# Patient Record
Sex: Male | Born: 1960
Health system: Southern US, Community
[De-identification: ages and names within clinical notes are randomized; demographics above are authoritative.]

## PROBLEM LIST (undated history)

## (undated) ENCOUNTER — Emergency Department (HOSPITAL_COMMUNITY): Disposition: A | Discharge status: Other Acute Inpatient Hospital | Payer: Self-pay

## (undated) DIAGNOSIS — N200 Calculus of kidney: Secondary | ICD-10-CM

## (undated) DIAGNOSIS — T7840XA Allergy, unspecified, initial encounter: Secondary | ICD-10-CM

## (undated) DIAGNOSIS — F99 Mental disorder, not otherwise specified: Secondary | ICD-10-CM

## (undated) DIAGNOSIS — G4730 Sleep apnea, unspecified: Secondary | ICD-10-CM

## (undated) DIAGNOSIS — I10 Essential (primary) hypertension: Secondary | ICD-10-CM

## (undated) DIAGNOSIS — K219 Gastro-esophageal reflux disease without esophagitis: Secondary | ICD-10-CM

## (undated) HISTORY — PX: LITHOTRIPSY: SUR834

## (undated) HISTORY — DX: Sleep apnea, unspecified: G47.30

## (undated) HISTORY — PX: APPENDECTOMY: SHX54

## (undated) HISTORY — DX: Allergy, unspecified, initial encounter: T78.40XA

---

## 1998-05-17 ENCOUNTER — Encounter: Admission: RE | Admit: 1998-05-17 | Discharge: 1998-08-15 | Payer: Self-pay | Admitting: Family Medicine

## 2011-10-09 ENCOUNTER — Other Ambulatory Visit: Payer: Self-pay | Admitting: Urology

## 2011-10-09 ENCOUNTER — Encounter (HOSPITAL_COMMUNITY): Payer: Self-pay | Admitting: Pharmacy Technician

## 2011-10-11 ENCOUNTER — Encounter (HOSPITAL_COMMUNITY): Payer: Self-pay | Admitting: *Deleted

## 2011-10-13 NOTE — H&P (Signed)
Urology Admission H&P  Chief Complaint: Right renal calculus for ESWL  History of Present Illness:    Tim Jones presents today as a more urgent walk-in. He has had recurrent nephrolithiasis and was last seen here about 2 years ago. At that time he had passed a small stone. He has been known in the past to have approximately a 9 mm stone in his right renal unit. We had talked in the past about elective lithotripsy. He has done fairly well clinically but has had gross hematuria over the last 2 days. He has had some increase obstructive voiding symptoms. He has had some mild right-sided flank discomfort.    Past Medical History  Diagnosis Date  . Hypertension   . GERD (gastroesophageal reflux disease)   . Mental disorder     OCD   Past Surgical History  Procedure Date  . Appendectomy     age 51    Home Medications:  No prescriptions prior to admission   Allergies:  Allergies  Allergen Reactions  . Codeine Nausea And Vomiting    History reviewed. No pertinent family history. Social History:  reports that he has been smoking Pipe and Cigars.  He does not have any smokeless tobacco history on file. He reports that he drinks alcohol. He reports that he does not use illicit drugs.  Review of Systems  Genitourinary: Positive for hematuria.  All other systems reviewed and are negative.    Physical Exam:  Vital signs in last 24 hours:   Physical Exam  Constitutional: He is oriented to person, place, and time. He appears well-developed and well-nourished.  HENT:  Head: Normocephalic and atraumatic.  Cardiovascular: Normal rate and regular rhythm.   Respiratory: Effort normal.  GI: Soft. He exhibits no distension and no mass. There is no tenderness.  Neurological: He is alert and oriented to person, place, and time.  Skin: Skin is warm and dry.    Laboratory Data:  No results found for this or any previous visit (from the past 24 hour(s)). No results found for this or any  previous visit (from the past 240 hour(s)). Creatinine: No results found for this basename: CREATININE:7 in the last 168 hours Baseline Creatinine:   Impression/Assessment:  Right renal calculus with gross hematuria.  Plan:  Tim Jones underwent a routine stone protocol CT. The official report remains pending but on my review the stone that was previously noted now appears to be in his right renal pelvis. There does not appear to be a significant degree of obstruction. This stone measures approximately 9 mm. At this point I do think treatment is indicated. He is interested in trying to have something done definitively. Given the current size and location of the stone, the treatment of choice would be lithotripsy. He understands that fragmentation at times can be less than ideal which can result in need for secondary procedure such as ureteroscopy, stent placement, etc. Again, however, I think lithotripsy would be the ideal treatment for this stone. We will attempt to get him on the schedule for sometime in our next opening. We will be sure that he does have some pain medication on hand in case this does become more uncomfortable.  Tim Jones S 10/13/2011, 5:33 PM

## 2011-10-14 ENCOUNTER — Ambulatory Visit (HOSPITAL_COMMUNITY): Payer: 59

## 2011-10-14 ENCOUNTER — Other Ambulatory Visit: Payer: Self-pay | Admitting: Urology

## 2011-10-14 ENCOUNTER — Ambulatory Visit (HOSPITAL_COMMUNITY)
Admission: RE | Admit: 2011-10-14 | Discharge: 2011-10-14 | Disposition: A | Discharge status: Home / Self Care | Payer: 59 | Source: Ambulatory Visit | Attending: Urology | Admitting: Urology

## 2011-10-14 ENCOUNTER — Emergency Department (HOSPITAL_COMMUNITY)
Admission: EM | Admit: 2011-10-14 | Discharge: 2011-10-15 | Disposition: A | Discharge status: Home / Self Care | Payer: 59 | Attending: Emergency Medicine | Admitting: Emergency Medicine

## 2011-10-14 ENCOUNTER — Encounter (HOSPITAL_COMMUNITY): Payer: Self-pay | Admitting: Emergency Medicine

## 2011-10-14 ENCOUNTER — Encounter (HOSPITAL_COMMUNITY)
Admission: RE | Disposition: A | Discharge status: Home / Self Care | Payer: Self-pay | Source: Ambulatory Visit | Attending: Urology

## 2011-10-14 DIAGNOSIS — F17200 Nicotine dependence, unspecified, uncomplicated: Secondary | ICD-10-CM | POA: Insufficient documentation

## 2011-10-14 DIAGNOSIS — R339 Retention of urine, unspecified: Secondary | ICD-10-CM

## 2011-10-14 DIAGNOSIS — I10 Essential (primary) hypertension: Secondary | ICD-10-CM | POA: Insufficient documentation

## 2011-10-14 DIAGNOSIS — K219 Gastro-esophageal reflux disease without esophagitis: Secondary | ICD-10-CM | POA: Insufficient documentation

## 2011-10-14 DIAGNOSIS — N200 Calculus of kidney: Secondary | ICD-10-CM | POA: Diagnosis present

## 2011-10-14 DIAGNOSIS — Z79899 Other long term (current) drug therapy: Secondary | ICD-10-CM | POA: Insufficient documentation

## 2011-10-14 HISTORY — DX: Calculus of kidney: N20.0

## 2011-10-14 HISTORY — DX: Mental disorder, not otherwise specified: F99

## 2011-10-14 HISTORY — DX: Gastro-esophageal reflux disease without esophagitis: K21.9

## 2011-10-14 HISTORY — DX: Essential (primary) hypertension: I10

## 2011-10-14 SURGERY — LITHOTRIPSY, ESWL
Anesthesia: LOCAL | Laterality: Right

## 2011-10-14 MED ORDER — DIAZEPAM 5 MG PO TABS
10.0000 mg | ORAL_TABLET | ORAL | Status: AC
  Administered 2011-10-14: 10 mg via ORAL

## 2011-10-14 MED ORDER — CIPROFLOXACIN IN D5W 400 MG/200ML IV SOLN
400.0000 mg | INTRAVENOUS | Status: AC
  Administered 2011-10-14: 400 mg via INTRAVENOUS

## 2011-10-14 MED ORDER — CIPROFLOXACIN IN D5W 400 MG/200ML IV SOLN
INTRAVENOUS | Status: AC
  Administered 2011-10-14: 400 mg via INTRAVENOUS
  Filled 2011-10-14: qty 200

## 2011-10-14 MED ORDER — HYDROCODONE-ACETAMINOPHEN 5-325 MG PO TABS
ORAL_TABLET | ORAL | Status: AC
  Administered 2011-10-14: 1 via ORAL
  Filled 2011-10-14: qty 1

## 2011-10-14 MED ORDER — CIPROFLOXACIN IN D5W 400 MG/200ML IV SOLN: 400.0000 mg | INTRAVENOUS | Status: DC

## 2011-10-14 MED ORDER — DIPHENHYDRAMINE HCL 25 MG PO CAPS
25.0000 mg | ORAL_CAPSULE | ORAL | Status: AC
  Administered 2011-10-14: 25 mg via ORAL

## 2011-10-14 MED ORDER — DIAZEPAM 5 MG PO TABS: 10.0000 mg | ORAL_TABLET | ORAL | Status: DC

## 2011-10-14 MED ORDER — DEXTROSE-NACL 5-0.45 % IV SOLN
INTRAVENOUS | Status: DC
  Administered 2011-10-14: 10:00:00 via INTRAVENOUS

## 2011-10-14 MED ORDER — DEXTROSE-NACL 5-0.45 % IV SOLN: INTRAVENOUS | Status: DC

## 2011-10-14 MED ORDER — HYDROCODONE-ACETAMINOPHEN 5-325 MG PO TABS
1.0000 | ORAL_TABLET | ORAL | Status: DC | PRN
  Administered 2011-10-14: 1 via ORAL

## 2011-10-14 MED ORDER — DIPHENHYDRAMINE HCL 25 MG PO CAPS: 25.0000 mg | ORAL_CAPSULE | ORAL | Status: DC

## 2011-10-14 NOTE — ED Notes (Signed)
Pt has kidney stones and had a lithotripsy by Dr Isabel Caprice this morning  Pt states the procedure went well and he was able to void but then he got where he could not urinate  Pt is c/o pain and discomfort  Pt states the last time he was able to void was about 3 hrs ago

## 2011-10-14 NOTE — Op Note (Signed)
See [facility] OP note scanned into chart. 

## 2011-10-14 NOTE — Progress Notes (Signed)
Called Dr Isabel Caprice as pt having pain. Verbal order received.

## 2011-10-15 LAB — DIFFERENTIAL
Basophils Relative: 0 % (ref 0–1)
Monocytes Absolute: 1.2 10*3/uL — ABNORMAL HIGH (ref 0.1–1.0)
Monocytes Relative: 7 % (ref 3–12)
Neutro Abs: 14.6 10*3/uL — ABNORMAL HIGH (ref 1.7–7.7)

## 2011-10-15 LAB — CBC
HCT: 41.9 % (ref 39.0–52.0)
Hemoglobin: 15 g/dL (ref 13.0–17.0)
MCH: 30.1 pg (ref 26.0–34.0)
MCHC: 35.8 g/dL (ref 30.0–36.0)

## 2011-10-15 LAB — POCT I-STAT, CHEM 8
BUN: 14 mg/dL (ref 6–23)
Chloride: 102 mEq/L (ref 96–112)
Creatinine, Ser: 0.9 mg/dL (ref 0.50–1.35)
Glucose, Bld: 143 mg/dL — ABNORMAL HIGH (ref 70–99)
Potassium: 4.4 mEq/L (ref 3.5–5.1)

## 2011-10-15 LAB — URINE MICROSCOPIC-ADD ON

## 2011-10-15 LAB — URINALYSIS, ROUTINE W REFLEX MICROSCOPIC
Bilirubin Urine: NEGATIVE
Glucose, UA: NEGATIVE mg/dL
Specific Gravity, Urine: 1.023 (ref 1.005–1.030)
Urobilinogen, UA: 0.2 mg/dL (ref 0.0–1.0)

## 2011-10-15 MED ORDER — CIPROFLOXACIN HCL 500 MG PO TABS: 500.0000 mg | ORAL_TABLET | Freq: Two times a day (BID) | ORAL | Status: AC

## 2011-10-15 MED ORDER — OXYCODONE-ACETAMINOPHEN 5-325 MG PO TABS
1.0000 | ORAL_TABLET | Freq: Once | ORAL | Status: AC
  Administered 2011-10-15: 1 via ORAL
  Filled 2011-10-15: qty 1

## 2011-10-15 MED ORDER — CIPROFLOXACIN HCL 500 MG PO TABS
500.0000 mg | ORAL_TABLET | Freq: Once | ORAL | Status: AC
  Administered 2011-10-15: 500 mg via ORAL
  Filled 2011-10-15: qty 1

## 2011-10-15 NOTE — ED Provider Notes (Signed)
History     CSN: [id number]  Arrival date & time 10/14/11  2008   First MD Initiated Contact with Patient 10/14/11 2345      Chief Complaint  Patient presents with  . Urinary Retention    (Consider location/radiation/quality/duration/timing/severity/associated sxs/prior treatment) Patient is a 51 y.o. male presenting with dysuria. The history is provided by the patient.  Dysuria  This is a new problem. The current episode started 3 to 5 hours ago. The problem occurs every urination (pressure and needs to urinate but cannot). The problem has not changed since onset.The quality of the pain is described as aching. The pain is at a severity of 7/10. The pain is severe. There has been no fever. There is no history of pyelonephritis. Pertinent negatives include no chills, no sweats, no nausea, no vomiting, no discharge, no frequency, no urgency and no flank pain. He has tried nothing for the symptoms. His past medical history is significant for kidney stones and urological procedure.  Was urinating well until this evening post lithotripsy then he had pain and needed to urinate but could not.    Past Medical History  Diagnosis Date  . Hypertension   . GERD (gastroesophageal reflux disease)   . Mental disorder     OCD  . Kidney stone     Past Surgical History  Procedure Date  . Appendectomy     age 39  . Lithotripsy     History reviewed. No pertinent family history.  History  Substance Use Topics  . Smoking status: Current Everyday Smoker    Types: Pipe, Cigars  . Smokeless tobacco: Not on file  . Alcohol Use: 0.0 oz/week    1-2 Glasses of wine per week      Review of Systems  Constitutional: Negative for chills.  HENT: Negative.   Eyes: Negative.   Respiratory: Negative.   Cardiovascular: Negative.   Gastrointestinal: Negative.  Negative for nausea and vomiting.  Genitourinary: Positive for dysuria and difficulty urinating. Negative for urgency, frequency and flank  pain.  Musculoskeletal: Negative.   Neurological: Negative.   Hematological: Negative.   Psychiatric/Behavioral: Negative.     Allergies  Codeine  Home Medications   Current Outpatient Rx  Name Route Sig Dispense Refill  . EZETIMIBE 10 MG PO TABS Oral Take 10 mg by mouth at bedtime.    . OMEGA-3 FATTY ACIDS 1000 MG PO CAPS Oral Take 1 g by mouth once a week.    Marland Kitchen HYDROCODONE-ACETAMINOPHEN 5-325 MG PO TABS Oral Take 1 tablet by mouth every 6 (six) hours as needed. For pain relief    . LOSARTAN POTASSIUM 50 MG PO TABS Oral Take 50 mg by mouth at bedtime.    . ADULT MULTIVITAMIN W/MINERALS CH Oral Take 1 tablet by mouth once a week.    Marland Kitchen NAPHAZOLINE-PHENIRAMINE 0.027-0.315 % OP SOLN Both Eyes Place 1 drop into both eyes 2 (two) times daily as needed. Itching and dry eyes    . OMEPRAZOLE 20 MG PO CPDR Oral Take 20 mg by mouth daily.    . SERTRALINE HCL 50 MG PO TABS Oral Take 50 mg by mouth at bedtime.    . TAMSULOSIN HCL 0.4 MG PO CAPS Oral Take 0.4 mg by mouth daily.      BP 152/94  Pulse 73  Temp(Src) 98.3 F (36.8 C) (Oral)  Resp 22  SpO2 98%  Physical Exam  Constitutional: He is oriented to person, place, and time. He appears well-developed and well-nourished.  HENT:  Head: Normocephalic and atraumatic.  Eyes: Conjunctivae are normal. Pupils are equal, round, and reactive to light.  Neck: Normal range of motion. Neck supple.  Cardiovascular: Normal rate and regular rhythm.   Pulmonary/Chest: Effort normal and breath sounds normal. He has no wheezes.  Abdominal: Soft. Bowel sounds are normal. There is no tenderness. There is no rebound and no guarding.  Musculoskeletal: Normal range of motion.  Neurological: He is alert and oriented to person, place, and time.  Skin: Skin is warm and dry.  Psychiatric: He has a normal mood and affect.    ED Course  Procedures (including critical care time)  Labs Reviewed  URINALYSIS, ROUTINE W REFLEX MICROSCOPIC - Abnormal; Notable  for the following:    APPearance CLOUDY (*)    Hgb urine dipstick LARGE (*)    All other components within normal limits  URINE MICROSCOPIC-ADD ON - Abnormal; Notable for the following:    Squamous Epithelial / LPF MANY (*)    Bacteria, UA FEW (*)    Casts HYALINE CASTS (*)    All other components within normal limits  CBC  DIFFERENTIAL  URINE CULTURE   Dg Abd 1 View - Kub  10/14/2011  *RADIOLOGY REPORT*  Clinical Data: Pre lithotripsy.  Renal calculus.  ABDOMEN - 1 VIEW  Comparison: None.  Findings: 10 mm stone projects over the lower pole of the right kidney.  Rounded calcification in the left lower anatomic pelvis, likely a calcified phlebolith.  Normal bowel gas pattern.  No free air organomegaly.  No bony abnormality.  IMPRESSION: 10 mm right lower pole renal calculus.  Original Report Authenticated By: Cyndie Chime, M.D.     No diagnosis found.    MDM  Foley catheter placed draining urine, start antibiotics follow up with urology in 7 days for voiding trial and removal.  Return for fevers chills or worsening symptoms         Chari Parmenter K Mariateresa Batra-Rasch, MD 10/15/11 [phone]

## 2011-10-15 NOTE — Discharge Instructions (Signed)
Acute Urinary Retention, Male You have been seen by a caregiver today because of your inability to urinate (pass your water). This is a common problem in elderly males. As men age their prostates become larger and block the flow of urine from the bladder. This is usually a problem that has come on gradually. It is often first noticed by having to get up at night to urinate. This is because as the prostate enlarges it is more difficult to empty the bladder completely. Treatment may involve a one time catheterization to empty the bladder. This is putting in a tube to drain your urine. Then you and your personal caregiver can decide at your earliest convenience how to handle this problem in the future. It may also be a problem that may not recur for years. Sometimes this problem can be caused by medications. In this case, all that is often necessary is to discontinue the offending agent. If you are to leave the foley catheter (a long, narrow, hollow tube) in and go home with a drainage system, you will need to discuss the best course of action with your caregiver. While the catheter is in, maintain a good intake of fluids. Keep the drainage bag emptied and lower than your catheter. This is so contaminated (infected) urine will not be flowing back into your bladder. This could lead to a urinary tract infection. Only take over-the-counter or prescription medicines for pain, discomfort, or fever as directed by your caregiver.  SEEK IMMEDIATE MEDICAL CARE IF:  You develop chills, fever, or show signs of generalized illness that occurs prior to seeing your caregiver. Document Released: 11/25/2000 Document Revised: 05/01/2011 Document Reviewed: 08/10/2008 [employer] Patient Information 2012 [employer]. 

## 2011-10-16 LAB — URINE CULTURE
Colony Count: NO GROWTH
Culture  Setup Time: 201302120613
Culture: NO GROWTH

## 2013-01-21 ENCOUNTER — Other Ambulatory Visit: Payer: Self-pay | Admitting: Family Medicine

## 2013-01-21 DIAGNOSIS — K7689 Other specified diseases of liver: Secondary | ICD-10-CM

## 2013-02-04 ENCOUNTER — Other Ambulatory Visit: Payer: Self-pay | Admitting: Family Medicine

## 2013-02-04 ENCOUNTER — Ambulatory Visit
Admission: RE | Admit: 2013-02-04 | Discharge: 2013-02-04 | Disposition: A | Discharge status: Home / Self Care | Payer: 59 | Source: Ambulatory Visit | Attending: Family Medicine | Admitting: Family Medicine

## 2013-02-04 DIAGNOSIS — K7689 Other specified diseases of liver: Secondary | ICD-10-CM

## 2014-02-03 ENCOUNTER — Other Ambulatory Visit: Payer: Self-pay | Admitting: Family Medicine

## 2014-02-03 DIAGNOSIS — F17200 Nicotine dependence, unspecified, uncomplicated: Secondary | ICD-10-CM

## 2014-02-14 ENCOUNTER — Ambulatory Visit
Admission: RE | Admit: 2014-02-14 | Discharge: 2014-02-14 | Disposition: A | Discharge status: Home / Self Care | Payer: No Typology Code available for payment source | Source: Ambulatory Visit | Attending: Family Medicine | Admitting: Family Medicine

## 2014-02-14 DIAGNOSIS — F17200 Nicotine dependence, unspecified, uncomplicated: Secondary | ICD-10-CM

## 2016-10-09 DIAGNOSIS — M7701 Medial epicondylitis, right elbow: Secondary | ICD-10-CM | POA: Diagnosis not present

## 2016-10-09 DIAGNOSIS — M25562 Pain in left knee: Secondary | ICD-10-CM | POA: Diagnosis not present

## 2016-12-26 DIAGNOSIS — Z0000 Encounter for general adult medical examination without abnormal findings: Secondary | ICD-10-CM | POA: Diagnosis not present

## 2016-12-26 DIAGNOSIS — R748 Abnormal levels of other serum enzymes: Secondary | ICD-10-CM | POA: Diagnosis not present

## 2016-12-26 DIAGNOSIS — R8290 Unspecified abnormal findings in urine: Secondary | ICD-10-CM | POA: Diagnosis not present

## 2017-01-06 ENCOUNTER — Other Ambulatory Visit: Payer: Self-pay | Admitting: Acute Care

## 2017-01-06 DIAGNOSIS — Z87891 Personal history of nicotine dependence: Secondary | ICD-10-CM

## 2017-01-10 ENCOUNTER — Encounter: Payer: Self-pay | Admitting: Acute Care

## 2017-02-11 DIAGNOSIS — H10402 Unspecified chronic conjunctivitis, left eye: Secondary | ICD-10-CM | POA: Diagnosis not present

## 2017-03-11 DIAGNOSIS — H10402 Unspecified chronic conjunctivitis, left eye: Secondary | ICD-10-CM | POA: Diagnosis not present

## 2017-07-13 DIAGNOSIS — Z23 Encounter for immunization: Secondary | ICD-10-CM | POA: Diagnosis not present

## 2017-10-21 DIAGNOSIS — R509 Fever, unspecified: Secondary | ICD-10-CM | POA: Diagnosis not present

## 2017-10-21 DIAGNOSIS — Z20828 Contact with and (suspected) exposure to other viral communicable diseases: Secondary | ICD-10-CM | POA: Diagnosis not present

## 2017-10-21 DIAGNOSIS — J988 Other specified respiratory disorders: Secondary | ICD-10-CM | POA: Diagnosis not present

## 2017-12-31 DIAGNOSIS — Z125 Encounter for screening for malignant neoplasm of prostate: Secondary | ICD-10-CM | POA: Diagnosis not present

## 2017-12-31 DIAGNOSIS — I10 Essential (primary) hypertension: Secondary | ICD-10-CM | POA: Diagnosis not present

## 2017-12-31 DIAGNOSIS — Z0000 Encounter for general adult medical examination without abnormal findings: Secondary | ICD-10-CM | POA: Diagnosis not present

## 2017-12-31 DIAGNOSIS — E7800 Pure hypercholesterolemia, unspecified: Secondary | ICD-10-CM | POA: Diagnosis not present

## 2018-06-24 DIAGNOSIS — D2262 Melanocytic nevi of left upper limb, including shoulder: Secondary | ICD-10-CM | POA: Diagnosis not present

## 2018-06-24 DIAGNOSIS — D2261 Melanocytic nevi of right upper limb, including shoulder: Secondary | ICD-10-CM | POA: Diagnosis not present

## 2018-06-24 DIAGNOSIS — D485 Neoplasm of uncertain behavior of skin: Secondary | ICD-10-CM | POA: Diagnosis not present

## 2018-06-24 DIAGNOSIS — D225 Melanocytic nevi of trunk: Secondary | ICD-10-CM | POA: Diagnosis not present

## 2018-10-02 DIAGNOSIS — M546 Pain in thoracic spine: Secondary | ICD-10-CM | POA: Diagnosis not present

## 2018-10-02 DIAGNOSIS — M542 Cervicalgia: Secondary | ICD-10-CM | POA: Diagnosis not present

## 2019-03-25 ENCOUNTER — Other Ambulatory Visit: Payer: Self-pay | Admitting: *Deleted

## 2019-03-25 DIAGNOSIS — Z122 Encounter for screening for malignant neoplasm of respiratory organs: Secondary | ICD-10-CM

## 2019-03-25 DIAGNOSIS — Z87891 Personal history of nicotine dependence: Secondary | ICD-10-CM

## 2019-03-25 NOTE — Progress Notes (Signed)
Chest  

## 2019-04-26 ENCOUNTER — Ambulatory Visit (INDEPENDENT_AMBULATORY_CARE_PROVIDER_SITE_OTHER): Payer: 59 | Admitting: Acute Care

## 2019-04-26 ENCOUNTER — Encounter: Payer: Self-pay | Admitting: Acute Care

## 2019-04-26 ENCOUNTER — Other Ambulatory Visit: Payer: Self-pay

## 2019-04-26 ENCOUNTER — Ambulatory Visit (INDEPENDENT_AMBULATORY_CARE_PROVIDER_SITE_OTHER)
Admission: RE | Admit: 2019-04-26 | Discharge: 2019-04-26 | Disposition: A | Discharge status: Home / Self Care | Payer: 59 | Source: Ambulatory Visit | Attending: Acute Care | Admitting: Acute Care

## 2019-04-26 VITALS — BP 124/70 | HR 89 | Temp 98.7°F | Wt 201.4 lb

## 2019-04-26 DIAGNOSIS — Z87891 Personal history of nicotine dependence: Secondary | ICD-10-CM

## 2019-04-26 DIAGNOSIS — Z122 Encounter for screening for malignant neoplasm of respiratory organs: Secondary | ICD-10-CM

## 2019-04-26 NOTE — Progress Notes (Signed)
Shared Decision Making Visit Lung Cancer Screening Program [phone])   Eligibility:  Age 58 y.o.  Pack Years Smoking History Calculation 38 pack year smoking history (# packs/per year x # years smoked)  Recent History of coughing up blood  no  Unexplained weight loss? no ( >Than 15 pounds within the last 6 months )  Prior History Lung / other cancer no (Diagnosis within the last 5 years already requiring surveillance chest CT Scans).  Smoking Status Former Smoker  Former Smokers: Years since quit: 7 years  Quit Date: 2013  Visit Components:  Discussion included one or more decision making aids. yes  Discussion included risk/benefits of screening. yes  Discussion included potential follow up diagnostic testing for abnormal scans. yes  Discussion included meaning and risk of over diagnosis. yes  Discussion included meaning and risk of False Positives. yes  Discussion included meaning of total radiation exposure. yes  Counseling Included:  Importance of adherence to annual lung cancer LDCT screening. yes  Impact of comorbidities on ability to participate in the program. yes  Ability and willingness to under diagnostic treatment. yes  Smoking Cessation Counseling:  Current Smokers:   Discussed importance of smoking cessation. NA  Information about tobacco cessation classes and interventions provided to patient. yes  Patient provided with "ticket" for LDCT Scan. yes  Symptomatic Patient. no  Counseling  Diagnosis Code: Tobacco Use Z72.0  Asymptomatic Patient yes  Counseling (Intermediate counseling: > three minutes counseling) [id number]  Former Smokers:   Discussed the importance of maintaining cigarette abstinence. yes  Diagnosis Code: Personal History of Nicotine Dependence. [id number]  Information about tobacco cessation classes and interventions provided to patient. Yes  Patient provided with "ticket" for LDCT Scan. yes  Written Order for Lung Cancer  Screening with LDCT placed in Epic. Yes (CT Chest Lung Cancer Screening Low Dose W/O CM) [id number] Z12.2-Screening of respiratory organs Z87.891-Personal history of nicotine dependence  BP 124/70 (BP Location: Right Arm, Cuff Size: Large)   Pulse 89   Temp 98.7 F (37.1 C) (Oral)   Wt 201 lb 6.4 oz (91.4 kg)   SpO2 97%   BMI 28.90 kg/m    I spent 25 minutes of face to face time with Tim Jones discussing the risks and benefits of lung cancer screening. We viewed a power point together that explained in detail the above noted topics. We took the time to pause the power point at intervals to allow for questions to be asked and answered to ensure understanding. We discussed that he had taken the single most powerful action possible to decrease his risk of developing lung cancer when he quit smoking. I counseled him to remain smoke free, and to contact me if he ever had the desire to smoke again so that I can provide resources and tools to help support the effort to remain smoke free. We discussed the time and location of the scan, and that either  Doroteo Glassman RN or I will call with the results within  24-48 hours of receiving them. He has my card and contact information in the event he needs to speak with me, in addition to a copy of the power point we reviewed as a resource. He verbalized understanding of all of the above and had no further questions upon leaving the office.     I explained to the patient that there has been a high incidence of coronary artery disease noted on these exams. I explained that this is a non-gated exam therefore  degree or severity cannot be determined. This patient is not on statin therapy. I have asked the patient to follow-up with their PCP regarding any incidental finding of coronary artery disease and management with diet or medication as they feel is clinically indicated. The patient verbalized understanding of the above and had no further questions.     Tim Spatz, NP 04/26/2019 4:14 PM

## 2019-04-26 NOTE — Patient Instructions (Signed)
Thank you for participating in the [facility]. It was our pleasure to meet you today. We will call you with the results of your scan within the next few days. Your scan will be assigned a Lung RADS category score by the physicians reading the scans.  This Lung RADS score determines follow up scanning.  See below for description of categories, and follow up screening recommendations. We will be in touch to schedule your follow up screening annually or based on recommendations of our providers. We will fax a copy of your scan results to your Primary Care Physician, or the physician who referred you to the program, to ensure they have the results. Please call the office if you have any questions or concerns regarding your scanning experience or results.  Our office number is [phone]. Please speak with Denise Phelps, RN. She is our Lung Cancer Screening RN. If she is unavailable when you call, please have the office staff send her a message. She will return your call at her earliest convenience. Remember, if your scan is normal, we will scan you annually as long as you continue to meet the criteria for the program. (Age 55-77, Current smoker or smoker who has quit within the last 15 years). If you are a smoker, remember, quitting is the single most powerful action that you can take to decrease your risk of lung cancer and other pulmonary, breathing related problems. We know quitting is hard, and we are here to help.  Please let us know if there is anything we can do to help you meet your goal of quitting. If you are a former smoker, congratulations. We are proud of you! Remain smoke free! Remember you can refer friends or family members through the number above.  We will screen them to make sure they meet criteria for the program. Thank you for helping us take better care of you by participating in Lung Screening.  Lung RADS Categories:  Lung RADS 1: no nodules  or definitely non-concerning nodules.  Recommendation is for a repeat annual scan in 12 months.  Lung RADS 2:  nodules that are non-concerning in appearance and behavior with a very low likelihood of becoming an active cancer. Recommendation is for a repeat annual scan in 12 months.  Lung RADS 3: nodules that are probably non-concerning , includes nodules with a low likelihood of becoming an active cancer.  Recommendation is for a 6-month repeat screening scan. Often noted after an upper respiratory illness. We will be in touch to make sure you have no questions, and to schedule your 6-month scan.  Lung RADS 4 A: nodules with concerning findings, recommendation is most often for a follow up scan in 3 months or additional testing based on our provider's assessment of the scan. We will be in touch to make sure you have no questions and to schedule the recommended 3 month follow up scan.  Lung RADS 4 B:  indicates findings that are concerning. We will be in touch with you to schedule additional diagnostic testing based on our provider's  assessment of the scan.   

## 2019-05-03 ENCOUNTER — Telehealth: Payer: Self-pay | Admitting: Acute Care

## 2019-05-03 DIAGNOSIS — Z87891 Personal history of nicotine dependence: Secondary | ICD-10-CM

## 2019-05-03 DIAGNOSIS — Z122 Encounter for screening for malignant neoplasm of respiratory organs: Secondary | ICD-10-CM

## 2019-05-05 NOTE — Telephone Encounter (Signed)
LMTC x 1  

## 2019-05-05 NOTE — Telephone Encounter (Signed)
Pt informed of CT results per Sarah Groce, NP.  PT verbalized understanding.  Copy sent to PCP.  Order placed for 1 yr f/u CT.  

## 2019-08-23 ENCOUNTER — Ambulatory Visit: Payer: 59 | Attending: Internal Medicine

## 2019-08-23 DIAGNOSIS — U071 COVID-19: Secondary | ICD-10-CM

## 2019-08-23 DIAGNOSIS — R238 Other skin changes: Secondary | ICD-10-CM

## 2019-08-24 LAB — NOVEL CORONAVIRUS, NAA: SARS-CoV-2, NAA: NOT DETECTED

## 2019-10-19 ENCOUNTER — Other Ambulatory Visit: Payer: Self-pay

## 2019-10-19 ENCOUNTER — Ambulatory Visit: Payer: 59 | Admitting: Cardiology

## 2019-10-19 ENCOUNTER — Encounter: Payer: Self-pay | Admitting: Cardiology

## 2019-10-19 VITALS — BP 130/80 | HR 73 | Temp 98.0°F | Resp 16 | Ht 70.0 in | Wt 201.0 lb

## 2019-10-19 DIAGNOSIS — K760 Fatty (change of) liver, not elsewhere classified: Secondary | ICD-10-CM

## 2019-10-19 DIAGNOSIS — E7800 Pure hypercholesterolemia, unspecified: Secondary | ICD-10-CM | POA: Diagnosis not present

## 2019-10-19 DIAGNOSIS — I10 Essential (primary) hypertension: Secondary | ICD-10-CM | POA: Diagnosis not present

## 2019-10-19 DIAGNOSIS — I700 Atherosclerosis of aorta: Secondary | ICD-10-CM | POA: Diagnosis not present

## 2019-10-19 DIAGNOSIS — I482 Chronic atrial fibrillation, unspecified: Secondary | ICD-10-CM

## 2019-10-19 NOTE — Progress Notes (Signed)
Primary Physician/Referring:  Hulan Fess, MD  Patient ID: Tim Jones, male    DOB: [birth year], 59 y.o.   MRN: [id number]  Chief Complaint  Patient presents with  . Hypertension  . Hyperlipidemia  . Follow-up   HPI:    Tim Jones  is a 59 y.o. [place] male with hypertension, hyperlipidemia, presents for discussion regarding lipids and cardiac risk ratification.  States that he remains asymptomatic without dyspnea or chest pain or palpitations or claudication.  Past Medical History:  Diagnosis Date  . GERD (gastroesophageal reflux disease)   . Hypertension   . Kidney stone   . Mental disorder    OCD   Past Surgical History:  Procedure Laterality Date  . APPENDECTOMY     age 58  . LITHOTRIPSY     Social History   Tobacco Use  . Smoking status: Former Smoker    Packs/day: 1.25    Years: 31.00    Pack years: 38.75    Types: Cigarettes    Quit date: 2013    Years since quitting: 8.1  . Smokeless tobacco: Current User    Types: Chew  . Tobacco comment: Former cigarette  smoker, quit 2013>> now smokes occasional cigar/ pipe  Substance Use Topics  . Alcohol use: Yes    Alcohol/week: 1.0 - 2.0 standard drinks    Types: 1 - 2 Glasses of wine per week   ROS  Review of Systems  Cardiovascular: Negative for dyspnea on exertion and leg swelling.  Gastrointestinal: Negative for melena.   Objective  Blood pressure 130/80, pulse 73, temperature 98 F (36.7 C), temperature source Temporal, resp. rate 16, height 5' 10"  (1.778 m), weight 201 lb (91.2 kg), SpO2 96 %.  Vitals with BMI 10/19/2019 04/26/2019 10/14/2011  Height 5' 10"  - -  Weight 201 lbs 201 lbs 6 oz -  BMI 22.63 - -  Systolic 335 456 256  Diastolic 80 70 94  Pulse 73 89 73     Physical Exam  Cardiovascular: Normal rate, regular rhythm, normal heart sounds and intact distal pulses. Exam reveals no gallop.  No murmur heard. No leg edema, no JVD.  Pulmonary/Chest: Effort normal and breath sounds normal.    Abdominal: Soft. Bowel sounds are normal.  Skin: Skin is warm and dry.   Laboratory examination:   External labs 06/16/2019:   Cholesterol, total 195.000 06/16/2019 HDL 42.000 06/16/2019 LDL 123.000 06/16/2019 Triglycerides 154.000 06/16/2019  A1C N/D; TSH 1.240 02/03/2019  Hemoglobin 15.400 02/03/2019 Creatinine, Serum 0.850 02/03/2019 Potassium 4.800 02/03/2019 ALT (SGPT) 50.000 06/16/2019  Medications and allergies   Allergies  Allergen Reactions  . Codeine Nausea And Vomiting     Current Outpatient Medications  Medication Instructions  . fish oil-omega-3 fatty acids 1 g, Weekly  . Multiple Vitamin (MULITIVITAMIN WITH MINERALS) TABS 1 tablet, Weekly  . sertraline (ZOLOFT) 50 mg, Daily at bedtime  . valsartan (DIOVAN) 160 mg, Oral, Daily   Radiology:   Cardiac calcium scoring 12/21/2014: Coronary calcium score is 0.  Heart is normal in size.  Small calcifications are present in the left hilar and subcarinal lymph nodes.  Left lower lobe bronchiectasis.  Low dose CT chest 04/26/2019: Lung-RADS 1, negative. Continue annual screening with low-dose chest CT without contrast in 12 months.  Aortic Atherosclerosis (ICD10-I70.0) and Emphysema (ICD10-J43.9).  Cardiac Studies:   Treadmill Exercise stress 02/06/2015: Indication: HTN, Dyspnea The patient exercised according to Bruce Protocol, Total time recorded 09:30 min achieving max heart rate of 148 which  was 87 % of MPHR for age and 10.94 METS of work. Normal BP response. Resting ECG NSR There was no ST-T changes of ischemia with exercise stress test. Stress terminated due to THR (>85% MPHR)/MPHR met. Rec: Primary prevention. Normal exercise tolerence.  Echocardiogram 01/10/2015:  Left ventricle cavity is normal in size. Mild concentric hypertrophy of the left ventricle. Normal global wall motion. Normal diastolic filling pattern. Calculated EF 55%. Interatrial septal dropout without definate ASD. Lipomatous hypertrophy  of the septum (normal).  Assessment     ICD-10-CM   1. Primary hypertension  I10 EKG 12-Lead    CMP14+EGFR    CMP14+EGFR  2. Hypercholesteremia  E78.00 Lipid Panel With LDL/HDL Ratio    Lipid Panel With LDL/HDL Ratio  3. Aortic atherosclerosis (HCC)  I70.0   4. Fatty liver  K76.0     EKG 10/19/2019: Normal sinus rhythm with rate of 75 bpm, borderline criteria for left atrial enlargement, normal axis. No evidence of ischemia. Single PVC.     No orders of the defined types were placed in this encounter.   Medications Discontinued During This Encounter  Medication Reason  . ezetimibe (ZETIA) 10 MG tablet Error  . HYDROcodone-acetaminophen (NORCO) 5-325 MG per tablet Error  . losartan (COZAAR) 50 MG tablet Error  . Naphazoline-Pheniramine (OPCON-A) 0.027-0.315 % SOLN Error  . omeprazole (PRILOSEC) 20 MG capsule Error  . Tamsulosin HCl (FLOMAX) 0.4 MG CAPS Error    Recommendations:   Tim Jones  is a 59 y.o.  [place] male with hypertension, hyperlipidemia, presents for discussion regarding lipids and cardiac risk ratification.  States that he remains asymptomatic without dyspnea or chest pain or palpitations or claudication.  I have reviewed his external records and labs including recent low-dose CT scan of the chest in view of tobacco use disorder, discussed with him regarding aortic atherosclerosis, hepatic steatosis and fatty liver and risk of cirrhosis, hyperlipidemia, advised him to increase taking Livalo 2 mg at least twice a week along with continued Zetia, presently taking Livalo once a week. Low dose being used in view of abnormal liver enzymes from fatty liver. Complete abstinence from any form of tobacco was also discussed especially with regards to atherosclerosis.  Obesity, fatty liver was discussed in detail and dietary modification discussed that could improve his lipids, also borderline elevated blood pressure.  Presently tolerating losartan with occasional episodes of  elevated blood pressure.  I encouraged him to join [facility].  I suspect with weight loss the LFT will normalize. We discussed regarding bariatric surgery, I recommend that he do lifestyle modification then considering surgical option.  This was a 40-minute office visit, I will check lipids and CMP in 3 months and hopefully see an improvement in lipid status with weight loss and lifestyle modification.  Unless problems, I will see him back on a as needed basis.  I do not think he needs any cardiac testing otherwise.  Adrian Prows, MD, [facility] 10/19/2019, 10:00 PM [facility] [place] Office: [phone]

## 2020-04-19 IMAGING — CT CT CHEST LUNG CANCER SCREENING LOW DOSE
1 series · 10 of 10 positions shown, 13 images · non-contrast
Comparison: 02/14/2014 screening chest CT.

CLINICAL DATA: 58-year-old asymptomatic male former smoker with 38
pack-year smoking history, quit smoking 7 years prior.

EXAM:
CT CHEST WITHOUT CONTRAST LOW-DOSE FOR LUNG CANCER SCREENING
TECHNIQUE: Multidetector CT imaging of the chest was performed following the
standard protocol without IV contrast.

[ct lung segmentation data · axial · 0.79mm/px · z∈[-357,-357]mm · 10 of 324 frames shown]
[frame 1/324  mediastinal]
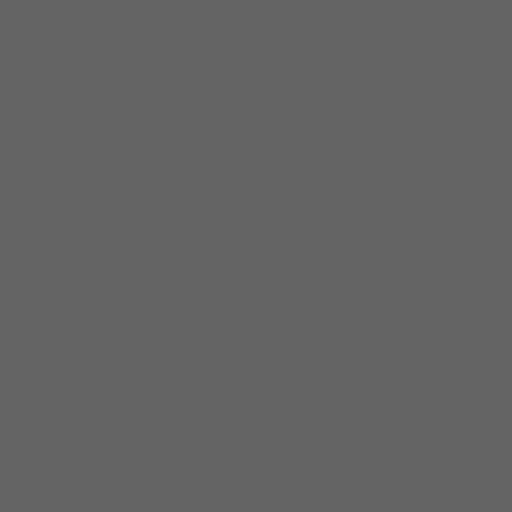
[frame 1/324  lung]
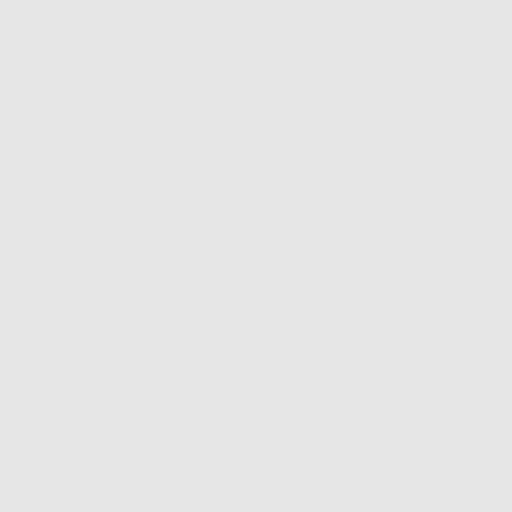
[frame 36/324  lung]
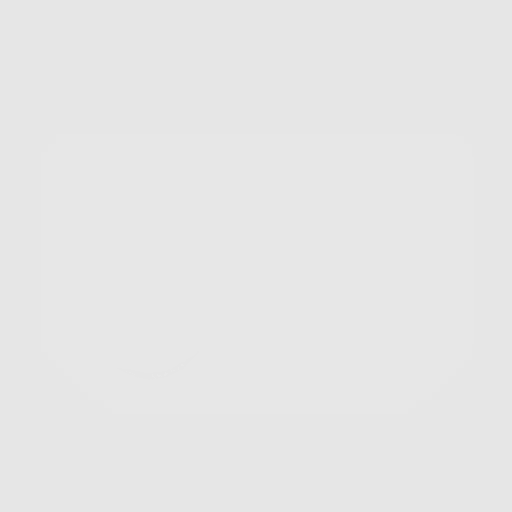
[frame 72/324  lung]
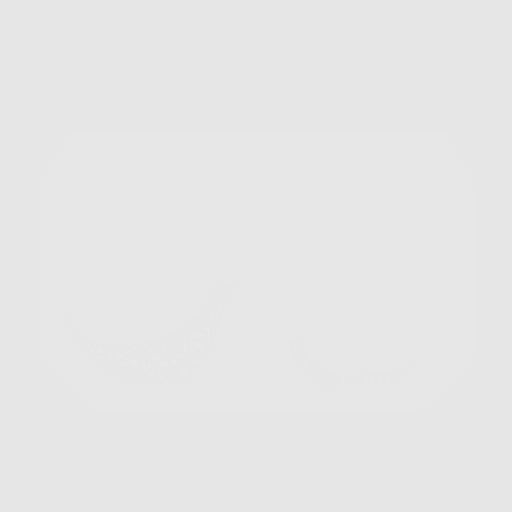
[frame 108/324  lung]
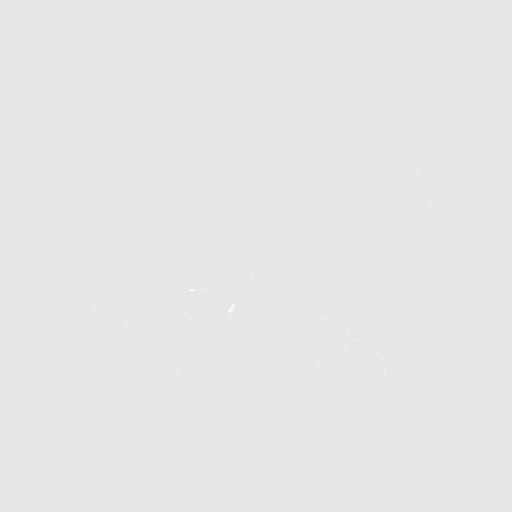
[frame 144/324  mediastinal]
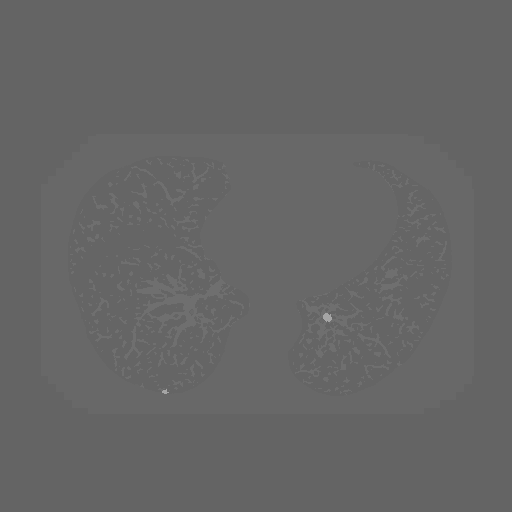
[frame 144/324  lung]
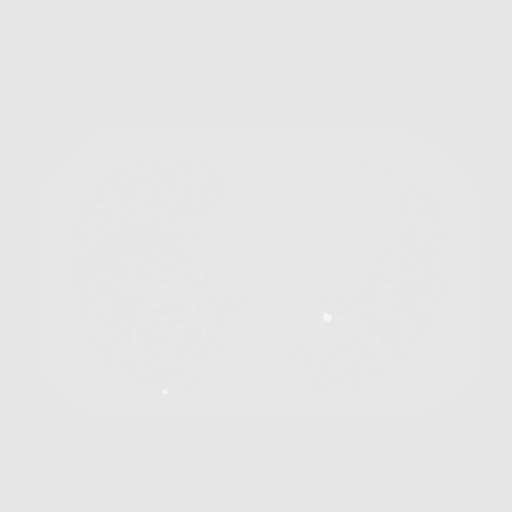
[frame 180/324  lung]
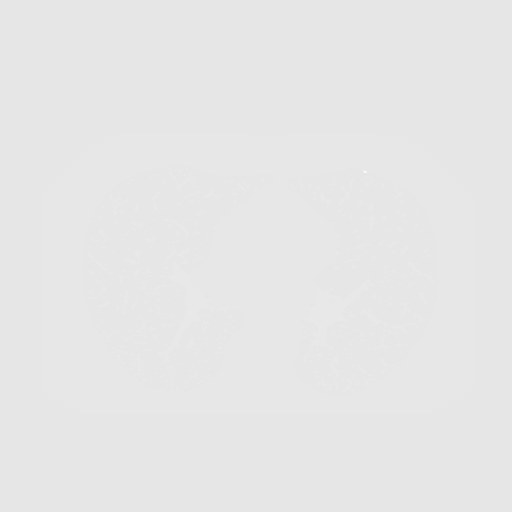
[frame 216/324  lung]
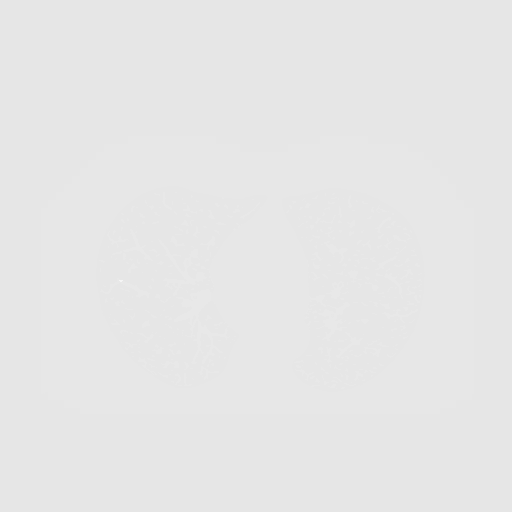
[frame 252/324  lung]
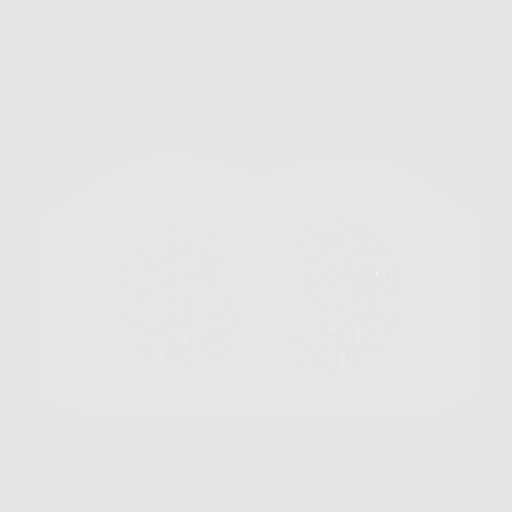
[frame 288/324  mediastinal]
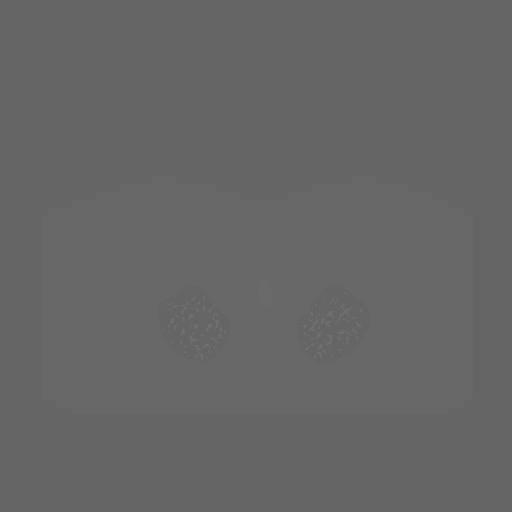
[frame 288/324  lung]
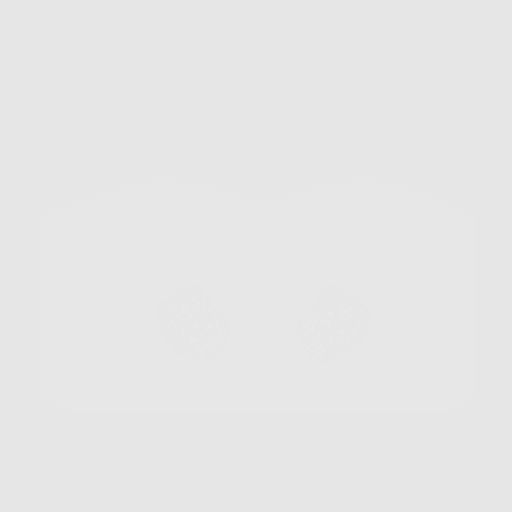
[frame 324/324  lung]
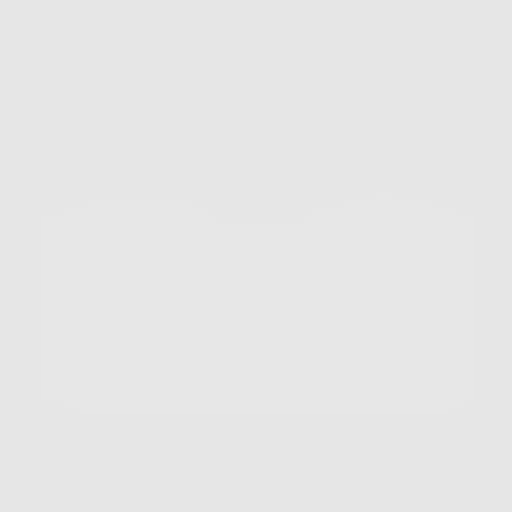

[10 of 10 positions shown; findings below may reference images not displayed]

FINDINGS: Cardiovascular: Normal heart size. No significant pericardial
effusion/thickening. Atherosclerotic nonaneurysmal thoracic aorta.
Normal caliber pulmonary arteries.

Mediastinum/Nodes: No discrete thyroid nodules. Unremarkable
esophagus. No pathologically enlarged axillary, mediastinal or hilar
lymph nodes, noting limited sensitivity for the detection of hilar
adenopathy on this noncontrast study.

Lungs/Pleura: No pneumothorax. No pleural effusion. Minimal
centrilobular emphysema. No acute consolidative airspace disease or
lung masses. No significant pulmonary nodules. Stable parenchymal
band and mild cylindrical bronchiectasis in the medial left lower
lobe compatible with postinfectious/postinflammatory scarring.

Upper abdomen: No acute abnormality.

Musculoskeletal: No aggressive appearing focal osseous lesions.
Symmetric mild bilateral gynecomastia is stable. Mild thoracic
spondylosis.
IMPRESSION: Lung-RADS 1, negative. Continue annual screening with low-dose chest
CT without contrast in 12 months.

Aortic Atherosclerosis (ZWRXM-JRG.G) and Emphysema (ZWRXM-XEC.L).

## 2024-05-11 ENCOUNTER — Ambulatory Visit
Admission: RE | Admit: 2024-05-11 | Discharge: 2024-05-11 | Disposition: A | Discharge status: Home / Self Care | Source: Ambulatory Visit | Attending: Family Medicine | Admitting: Family Medicine

## 2024-05-11 VITALS — BP 124/77 | HR 69 | Temp 97.5°F | Resp 16

## 2024-05-11 DIAGNOSIS — R9431 Abnormal electrocardiogram [ECG] [EKG]: Secondary | ICD-10-CM

## 2024-05-11 DIAGNOSIS — R0789 Other chest pain: Secondary | ICD-10-CM | POA: Diagnosis not present

## 2024-05-11 DIAGNOSIS — K209 Esophagitis, unspecified without bleeding: Secondary | ICD-10-CM | POA: Diagnosis not present

## 2024-05-11 MED ORDER — OMEPRAZOLE 20 MG PO CPDR: 20.0000 mg | DELAYED_RELEASE_CAPSULE | Freq: Every day | ORAL | 0 refills | Status: DC

## 2024-05-11 MED ORDER — FAMOTIDINE 20 MG PO TABS: 20.0000 mg | ORAL_TABLET | Freq: Two times a day (BID) | ORAL | 0 refills | Status: DC

## 2024-05-11 NOTE — Discharge Instructions (Addendum)
 Make sure you follow up with your heart doctor urgently. You do have an abnormal EKG and should get a recheck with your heart doctor. In the meantime, start Prilosec and Pepcid . Avoid acidic foods.

## 2024-05-11 NOTE — ED Provider Notes (Signed)
 [facility]  Note:  This document was prepared using Conservation officer, historic buildings and may include unintentional dictation errors.  MRN: [id number] DOB: 08/23/1961  Subjective:   Tim Jones is a 63 y.o. male presenting for 3 day history of throat pain, painful swallowing in the esophagus. Has a history of GERD. Tested for COVID 19 and was negative.  He did start to feel some drainage as well.  No general abdominal pain, nausea, vomiting, diarrhea, bloody stools.  No history of heart disease or MI.  Takes antacids regularly.  Previously used Prilosec to address acid reflux but as he made diet changes and lost weight he stopped taking it.  No current facility-administered medications for this encounter.  Current Outpatient Medications:    acetaminophen  (TYLENOL ) 500 MG tablet, Take 500 mg by mouth every 6 (six) hours as needed., Disp: , Rfl:    albuterol (VENTOLIN HFA) 108 (90 Base) MCG/ACT inhaler, SMARTSIG:2 Puff(s) By Mouth Every 6 Hours PRN, Disp: , Rfl:    ZEPBOUND 15 MG/0.5ML Pen, SMARTSIG:15 Milligram(s) SUB-Q Once a Week, Disp: , Rfl:    fish oil-omega-3 fatty acids 1000 MG capsule, Take 1 g by mouth once a week., Disp: , Rfl:    Multiple Vitamin (MULITIVITAMIN WITH MINERALS) TABS, Take 1 tablet by mouth once a week., Disp: , Rfl:    Pitavastatin Calcium (LIVALO) 2 MG TABS, Take 1 tablet by mouth as directed. Twice weekly, Disp: , Rfl:    sertraline (ZOLOFT) 50 MG tablet, Take 50 mg by mouth at bedtime., Disp: , Rfl:    valsartan (DIOVAN) 160 MG tablet, Take 160 mg by mouth daily., Disp: , Rfl:    Allergies  Allergen Reactions   Codeine Nausea And Vomiting    Past Medical History:  Diagnosis Date   GERD (gastroesophageal reflux disease)    Hypertension    Kidney stone    Mental disorder    OCD     Past Surgical History:  Procedure Laterality Date   APPENDECTOMY     age 89   LITHOTRIPSY      Family History  Problem Relation Age of Onset    Liver cancer Father     Social History   Tobacco Use   Smoking status: Former    Current packs/day: 0.00    Average packs/day: 1.3 packs/day for 31.0 years (38.8 ttl pk-yrs)    Types: Cigarettes    Start date: 17    Quit date: 2013    Years since quitting: 12.6   Smokeless tobacco: Current    Types: Chew   Tobacco comments:    Former cigarette  smoker, quit 2013>> now smokes occasional cigar/ pipe  Substance Use Topics   Alcohol use: Yes    Alcohol/week: 1.0 - 2.0 standard drink of alcohol    Types: 1 - 2 Glasses of wine per week   Drug use: No    ROS   Objective:   Vitals: BP 124/77 (BP Location: Right Arm)   Pulse 69   Temp (!) 97.5 F (36.4 C) (Oral)   Resp 16   SpO2 97%   Physical Exam Constitutional:      General: He is not in acute distress.    Appearance: Normal appearance. He is well-developed and normal weight. He is not ill-appearing, toxic-appearing or diaphoretic.  HENT:     Head: Normocephalic and atraumatic.     Right Ear: External ear normal.     Left Ear: External ear normal.  Nose: Nose normal.     Mouth/Throat:     Mouth: Mucous membranes are moist.     Pharynx: No pharyngeal swelling, oropharyngeal exudate, posterior oropharyngeal erythema or uvula swelling.     Tonsils: No tonsillar exudate or tonsillar abscesses. 0 on the right. 0 on the left.     Comments: Thick streaks of postnasal drainage overlying pharynx.  Eyes:     General: No scleral icterus.       Right eye: No discharge.        Left eye: No discharge.     Extraocular Movements: Extraocular movements intact.     Conjunctiva/sclera: Conjunctivae normal.  Cardiovascular:     Rate and Rhythm: Normal rate and regular rhythm.     Heart sounds: No murmur heard.    No friction rub. No gallop.  Pulmonary:     Effort: Pulmonary effort is normal. No respiratory distress.     Breath sounds: No wheezing or rales.  Abdominal:     General: Bowel sounds are normal. There is no  distension.     Palpations: Abdomen is soft. There is no mass.     Tenderness: There is no abdominal tenderness. There is no right CVA tenderness, left CVA tenderness, guarding or rebound.  Musculoskeletal:     Cervical back: Normal range of motion.  Skin:    General: Skin is warm and dry.  Neurological:     Mental Status: He is alert and oriented to person, place, and time.  Psychiatric:        Mood and Affect: Mood normal.        Behavior: Behavior normal.        Thought Content: Thought content normal.        Judgment: Judgment normal.     ED ECG REPORT   Date: 05/11/2024  EKG Time: 2:27 PM  Rate: 67bpm  Rhythm: normal sinus rhythm and premature atrial contractions (PAC)  Axis: normal  Intervals:none  ST&T Change: T wave flattening in lead aVL, T wave inversion in V2  Narrative Interpretation: Sinus rhythm with PAC and nonspecific T wave changes as above.  Comparable to previous EKG except for PAC.    Assessment and Plan :   PDMP not reviewed this encounter.  1. Acute esophagitis   2. Mid sternal chest pain   3. Nonspecific abnormal electrocardiogram (ECG) (EKG)    I do not suspect pharyngitis, epiglottitis, ACS.  Recommended managing for acute esophagitis secondary to his GERD.  Restart Prilosec, famotidine .  Can use conservative management for postnasal drainage as a possible source of his symptoms with Zyrtec and pseudoephedrine.  Recommended he follow-up with his GI specialist and his cardiologist.  Counseled patient on potential for adverse effects with medications prescribed/recommended today, ER and return-to-clinic precautions discussed, patient verbalized understanding.    Christopher Savannah, [place] 05/11/24 1429

## 2024-05-11 NOTE — ED Triage Notes (Signed)
 Pt reports sore throat when swallows, pain in esophagus when swallows x 3 days. Tylenol  gives some relief.

## 2024-06-01 ENCOUNTER — Ambulatory Visit: Admitting: Student in an Organized Health Care Education/Training Program

## 2024-06-01 ENCOUNTER — Encounter: Payer: Self-pay | Admitting: Student in an Organized Health Care Education/Training Program

## 2024-06-01 VITALS — BP 121/74 | HR 72 | Ht 68.0 in | Wt 169.0 lb

## 2024-06-01 DIAGNOSIS — R3129 Other microscopic hematuria: Secondary | ICD-10-CM | POA: Diagnosis not present

## 2024-06-01 DIAGNOSIS — G4733 Obstructive sleep apnea (adult) (pediatric): Secondary | ICD-10-CM | POA: Diagnosis not present

## 2024-06-01 DIAGNOSIS — R399 Unspecified symptoms and signs involving the genitourinary system: Secondary | ICD-10-CM | POA: Diagnosis not present

## 2024-06-01 DIAGNOSIS — Z23 Encounter for immunization: Secondary | ICD-10-CM | POA: Diagnosis not present

## 2024-06-01 DIAGNOSIS — Z122 Encounter for screening for malignant neoplasm of respiratory organs: Secondary | ICD-10-CM | POA: Diagnosis not present

## 2024-06-01 DIAGNOSIS — F429 Obsessive-compulsive disorder, unspecified: Secondary | ICD-10-CM

## 2024-06-01 DIAGNOSIS — K760 Fatty (change of) liver, not elsewhere classified: Secondary | ICD-10-CM

## 2024-06-01 DIAGNOSIS — E7800 Pure hypercholesterolemia, unspecified: Secondary | ICD-10-CM

## 2024-06-01 DIAGNOSIS — I10 Essential (primary) hypertension: Secondary | ICD-10-CM

## 2024-06-01 DIAGNOSIS — E785 Hyperlipidemia, unspecified: Secondary | ICD-10-CM | POA: Insufficient documentation

## 2024-06-01 LAB — COMPREHENSIVE METABOLIC PANEL WITH GFR
ALT: 53 U/L (ref 0–53)
AST: 22 U/L (ref 0–37)
Albumin: 4.8 g/dL (ref 3.5–5.2)
Alkaline Phosphatase: 39 U/L (ref 39–117)
BUN: 14 mg/dL (ref 6–23)
CO2: 30 meq/L (ref 19–32)
Calcium: 9.9 mg/dL (ref 8.4–10.5)
Chloride: 101 meq/L (ref 96–112)
Creatinine, Ser: 0.72 mg/dL (ref 0.40–1.50)
GFR: 98.79 mL/min (ref >60.00)
Glucose, Bld: 84 mg/dL (ref 70–99)
Potassium: 4.3 meq/L (ref 3.5–5.1)
Sodium: 139 meq/L (ref 135–145)
Total Bilirubin: 0.8 mg/dL (ref 0.2–1.2)
Total Protein: 7.2 g/dL (ref 6.0–8.3)

## 2024-06-01 LAB — URINALYSIS, ROUTINE W REFLEX MICROSCOPIC
Bilirubin Urine: NEGATIVE
Hgb urine dipstick: NEGATIVE
Ketones, ur: NEGATIVE
Leukocytes,Ua: NEGATIVE
Nitrite: NEGATIVE
RBC / HPF: NONE SEEN (ref 0–?)
Specific Gravity, Urine: 1.01 (ref 1.000–1.030)
Total Protein, Urine: NEGATIVE
Urine Glucose: NEGATIVE
Urobilinogen, UA: 0.2 (ref 0.0–1.0)
WBC, UA: NONE SEEN (ref 0–?)
pH: 6.5 (ref 5.0–8.0)

## 2024-06-01 LAB — LIPID PANEL
Cholesterol: 218 mg/dL — ABNORMAL HIGH (ref 0–200)
HDL: 62.2 mg/dL (ref >39.00)
LDL Cholesterol: 137 mg/dL — ABNORMAL HIGH (ref 0–99)
NonHDL: 156.12
Total CHOL/HDL Ratio: 4
Triglycerides: 95 mg/dL (ref 0.0–149.0)
VLDL: 19 mg/dL (ref 0.0–40.0)

## 2024-06-01 LAB — PSA: PSA: 2.28 ng/mL (ref 0.10–4.00)

## 2024-06-01 LAB — HEMOGLOBIN A1C: Hgb A1c MFr Bld: 5.5 % (ref 4.6–6.5)

## 2024-06-01 LAB — MICROALBUMIN / CREATININE URINE RATIO
Creatinine,U: 100.1 mg/dL
Microalb Creat Ratio: 8 mg/g (ref 0.0–30.0)
Microalb, Ur: 0.8 mg/dL (ref 0.0–1.9)

## 2024-06-01 MED ORDER — TAMSULOSIN HCL 0.4 MG PO CAPS: 0.4000 mg | ORAL_CAPSULE | Freq: Every day | ORAL | 3 refills | Status: DC

## 2024-06-01 MED ORDER — VALSARTAN 160 MG PO TABS: 160.0000 mg | ORAL_TABLET | Freq: Every day | ORAL | 3 refills | Status: AC

## 2024-06-01 MED ORDER — FAMOTIDINE 20 MG PO TABS: 20.0000 mg | ORAL_TABLET | Freq: Two times a day (BID) | ORAL | 0 refills | Status: AC

## 2024-06-01 MED ORDER — ZEPBOUND 15 MG/0.5ML ~~LOC~~ SOAJ: 15.0000 mg | SUBCUTANEOUS | 3 refills | Status: AC

## 2024-06-01 MED ORDER — SERTRALINE HCL 100 MG PO TABS: 100.0000 mg | ORAL_TABLET | Freq: Every day | ORAL | 3 refills | Status: AC

## 2024-06-01 MED ORDER — PITAVASTATIN CALCIUM 2 MG PO TABS: 1.0000 | ORAL_TABLET | ORAL | 3 refills | Status: DC

## 2024-06-01 NOTE — Assessment & Plan Note (Signed)
 Chronic and stable.  Doing well on treatment with CPAP and Zepbound.  Plan to continue Zepbound for medical management of OSA with 15 mg once weekly injections.

## 2024-06-01 NOTE — Assessment & Plan Note (Signed)
 Moderate burden of lower urinary tract symptoms over the last 6 months.  On ultrasound he has an enlarged prostate diffusely with small protrusion into the bladder.  Will check PSA today.  Start Flomax 0.4 mg daily.

## 2024-06-01 NOTE — Assessment & Plan Note (Signed)
 History of metabolic associated fatty liver disease on ultrasound, associated with metabolic syndrome.  History of elevated AST ALT consistent with steatosis.  On treatment with Zepbound and improving nicely.  Recheck liver enzymes today.  Continue Zepbound 15 mg once weekly.

## 2024-06-01 NOTE — Patient Instructions (Addendum)
  VISIT SUMMARY: You came in today for a general checkup with concerns about urinary symptoms and elevated uric acid levels. We discussed your symptoms, reviewed your medications, and addressed your concerns about prostate health, gout, and other health issues.  YOUR PLAN: -BENIGN PROSTATIC HYPERPLASIA WITH LOWER URINARY TRACT SYMPTOMS: This condition means your prostate is enlarged, which is causing your urinary symptoms. We have prescribed tamsulosin to help improve your urine flow and reduce these symptoms. Be aware of the risk of dizziness when standing up quickly. We will also check your PSA levels to rule out prostate cancer.  -OBESITY TREATED WITH ZEPBOUND: Your weight loss is being successfully managed with Zepbound. You have lost significant weight and no further reduction is needed. Continue taking Zepbound as currently dosed, and we may consider reducing the dose to maintain your current weight.  -HYPERTENSION, WELL-CONTROLLED: Your blood pressure is well-controlled with your current medication. No changes are needed at this time.  -HYPERLIPIDEMIA, ON STATIN THERAPY: Your cholesterol levels are being managed with Livalo due to your liver sensitivity to other statins. We will check your cholesterol levels to ensure they remain in a healthy range.  -OBSTRUCTIVE SLEEP APNEA: Your sleep apnea is being managed with a CPAP machine, which is improving your sleep quality. Continue using the CPAP machine as you have been.  -HISTORY OF NEPHROLITHIASIS WITH INTERMITTENT HEMATURIA: You have a history of kidney stones and recent blood in your urine with uric acid crystals. We will check for blood in your urine, and if it is present, we will order a CT scan of your kidneys and abdomen to investigate further.  -OBSESSIVE-COMPULSIVE DISORDER, STABLE ON SERTRALINE: Your OCD is stable with your current dose of sertraline. Continue taking your medication as prescribed.  -GENERAL HEALTH MAINTENANCE: We  discussed lung cancer screening and determined that your risk is low due to quitting smoking 15 years ago. However, if you wish, we can consider a CT scan of your chest for lung cancer screening.  INSTRUCTIONS: We will check your PSA levels and cholesterol levels. If blood is found in your urine, we will order a CT scan of your kidneys and abdomen. Continue taking your medications as prescribed and follow up with any new symptoms or concerns.  Normal joints normal joints alert, conversational, full strength upper and lower extremities, normal gait and balance obtained

## 2024-06-01 NOTE — Assessment & Plan Note (Signed)
 Chronic and stable.  Blood pressure well-controlled on valsartan 160 mg daily.  Check renal function today.

## 2024-06-01 NOTE — Progress Notes (Signed)
 New Patient Office Visit  Subjective    Patient ID: Tim Jones, male    DOB: 04/29/1961  Age: 63 y.o. MRN: [id number]  CC:  Chief Complaint  Patient presents with   Establish Care    Bone density   Flu-Patient would like Tdap-If he needs he will like Colonoscopy- 6 years ago completed  Lung Cancer screening- Would like to complete     HPI  Discussed the use of AI scribe software for clinical note transcription with the patient, who gave verbal consent to proceed.  History of Present Illness Tim Jones is a 63 year old male who presents for a general checkup with concerns about urinary symptoms and elevated uric acid levels.  He has experienced increased urinary frequency and urgency, particularly during the day, with a weak and interrupted stream. These symptoms have developed gradually over the past three to six months. He urinates approximately every hour, which is a new pattern for him. No burning sensation during urination, although he had a urinary tract infection in the past with traces of blood in the urine. He is concerned about his prostate, noting the increased frequency and urgency of urination. He lost a cousin to prostate cancer on the maternal side.  A recent urine test showed uric acid, which is a new finding for him. He has experienced occasional pain in his big toe, raising concerns about possible gout, although he has no personal or family history of gout. He has a history of kidney stones, with the last significant episode occurring 5-6 years ago, requiring extracorporeal shock wave lithotripsy. He suspects he may be passing a stone due to recent blood in the urine and occasional burning.  He has a history of hypertension, hyperlipidemia, and sleep apnea. He takes medication for blood pressure and reports a recent reading of 121/74 mmHg. He uses a CPAP machine for sleep apnea and mentions that he can now sleep without it but continues its use out of habit. He  takes sertraline 100 mg for OCD and Livalo every other day for cholesterol management due to previous liver issues with statins. He also takes Pepcid  at bedtime for reflux symptoms, which have resolved.  He has been on Zepbound for weight loss for over a year. He has lost 35 pounds over the past year and attributes this to the medication. He engages in physical activities such as woodworking, biking, and hiking, and has lost significant weight, which he attributes to improved health outcomes.  He quit smoking 10 years ago but occasionally uses nicotine vape. He is concerned about bone density due to weight loss, although he has no family history of osteoporosis. No chest pain or shortness of breath with exertion, no recent surgeries, no history of heart attack or stroke, no significant hearing or vision issues, no lumps or bumps on skin.     Outpatient Encounter Medications as of 06/01/2024  Medication Sig   fish oil-omega-3 fatty acids 1000 MG capsule Take 1 g by mouth once a week.   Multiple Vitamin (MULITIVITAMIN WITH MINERALS) TABS Take 1 tablet by mouth once a week.   tamsulosin (FLOMAX) 0.4 MG CAPS capsule Take 1 capsule (0.4 mg total) by mouth daily.   [DISCONTINUED] famotidine  (PEPCID ) 20 MG tablet Take 1 tablet (20 mg total) by mouth 2 (two) times daily.   [DISCONTINUED] sertraline (ZOLOFT) 100 MG tablet Take 100 mg by mouth daily.   [DISCONTINUED] valsartan (DIOVAN) 160 MG tablet Take 160 mg by mouth daily.   [  DISCONTINUED] ZEPBOUND 15 MG/0.5ML Pen SMARTSIG:15 Milligram(s) SUB-Q Once a Week   famotidine  (PEPCID ) 20 MG tablet Take 1 tablet (20 mg total) by mouth 2 (two) times daily.   Pitavastatin Calcium (LIVALO) 2 MG TABS Take 1 tablet (2 mg total) by mouth every other day.   sertraline (ZOLOFT) 100 MG tablet Take 1 tablet (100 mg total) by mouth daily.   valsartan (DIOVAN) 160 MG tablet Take 1 tablet (160 mg total) by mouth daily.   ZEPBOUND 15 MG/0.5ML Pen Inject 15 mg into the skin  once a week.   [DISCONTINUED] acetaminophen  (TYLENOL ) 500 MG tablet Take 500 mg by mouth every 6 (six) hours as needed.   [DISCONTINUED] albuterol (VENTOLIN HFA) 108 (90 Base) MCG/ACT inhaler SMARTSIG:2 Puff(s) By Mouth Every 6 Hours PRN   [DISCONTINUED] omeprazole  (PRILOSEC) 20 MG capsule Take 1 capsule (20 mg total) by mouth daily.   [DISCONTINUED] Pitavastatin Calcium (LIVALO) 2 MG TABS Take 1 tablet by mouth as directed. Twice weekly (Patient not taking: Reported on 06/01/2024)   [DISCONTINUED] sertraline (ZOLOFT) 50 MG tablet Take 50 mg by mouth at bedtime. (Patient not taking: Reported on 06/01/2024)   No facility-administered encounter medications on file as of 06/01/2024.    Past Medical History:  Diagnosis Date   Allergy    GERD (gastroesophageal reflux disease)    Hypertension    Kidney stone    Mental disorder    OCD   Sleep apnea     Past Surgical History:  Procedure Laterality Date   APPENDECTOMY     age 45   LITHOTRIPSY      Family History  Problem Relation Age of Onset   Arthritis Mother    Hypertension Mother    Liver cancer Father    Arthritis Father    Kidney disease Father     Social History   Socioeconomic History   Marital status: Married    Spouse name: Not on file   Number of children: 2   Years of education: Not on file   Highest education level: Professional school degree (e.g., MD, DDS, DVM, JD)  Occupational History   Not on file  Tobacco Use   Smoking status: Former    Current packs/day: 0.00    Average packs/day: 1.3 packs/day for 31.0 years (38.8 ttl pk-yrs)    Types: Cigarettes, Pipe    Start date: 28    Quit date: 2013    Years since quitting: 12.7   Smokeless tobacco: Current    Types: Chew   Tobacco comments:    Former cigarette  smoker, quit 2013>> now smokes occasional cigar/ pipe  Substance and Sexual Activity   Alcohol use: Yes    Alcohol/week: 1.0 - 2.0 standard drink of alcohol    Types: 1 - 2 Glasses of wine per  week   Drug use: No   Sexual activity: Yes    Birth control/protection: None  Other Topics Concern   Not on file  Social History Narrative   Not on file   Social Drivers of Health   Financial Resource Strain: Low Risk  (05/31/2024)   Overall Financial Resource Strain (CARDIA)    Difficulty of Paying Living Expenses: Not hard at all  Food Insecurity: No Food Insecurity (05/31/2024)   Hunger Vital Sign    Worried About Running Out of Food in the Last Year: Never true    Ran Out of Food in the Last Year: Never true  Transportation Needs: No Transportation Needs (05/31/2024)  PRAPARE - Administrator, Civil Service (Medical): No    Lack of Transportation (Non-Medical): No  Physical Activity: Insufficiently Active (05/31/2024)   Exercise Vital Sign    Days of Exercise per Week: 3 days    Minutes of Exercise per Session: 30 min  Stress: No Stress Concern Present (05/31/2024)   [employer] of Occupational Health - Occupational Stress Questionnaire    Feeling of Stress: Not at all  Social Connections: Socially Integrated (05/31/2024)   Social Connection and Isolation Panel    Frequency of Communication with Friends and Family: More than three times a week    Frequency of Social Gatherings with Friends and Family: More than three times a week    Attends Religious Services: 1 to 4 times per year    Active Member of [employer] or Organizations: Yes    Attends Banker Meetings: 1 to 4 times per year    Marital Status: Married  Catering manager Violence: Not on file       Objective    BP 121/74   Pulse 72   Ht 5' 8 (1.727 m)   Wt 169 lb (76.7 kg)   SpO2 100%   BMI 25.70 kg/m   Physical Exam  Gen: Well-appearing man Eyes; normal Ears: Normal tympanic membranes bilaterally, normal hearing Neck: Normal thyroid, no nodules or adenopathy Heart: Regular, no murmur Lungs: Unlabored, clear throughout with no crackles Abd: Soft, nontender, no organomegaly,  no hernias Ext: Warm, no edema,  Neuro: Alert, conversational, normal gait and balance, full strength upper and lower extremities Psych: appropriate mood and affect, not anxious or depressed appearing  POCUS: Obtained due to lower urinary tract symptoms.  Prostate is moderately enlarged measuring 4.5 x 4 x 4 cm with small protrusion into the bladder.  Bladder is not distended     Assessment & Plan:    Problem List Items Addressed This Visit       High   Hypertension (Chronic)   Chronic and stable.  Blood pressure well-controlled on valsartan 160 mg daily.  Check renal function today.      Relevant Medications   Pitavastatin Calcium (LIVALO) 2 MG TABS   valsartan (DIOVAN) 160 MG tablet   Other Relevant Orders   Microalbumin / creatinine urine ratio   Comprehensive metabolic panel with GFR   Metabolic dysfunction-associated fatty liver disease (MAFLD) (Chronic)   History of metabolic associated fatty liver disease on ultrasound, associated with metabolic syndrome.  History of elevated AST ALT consistent with steatosis.  On treatment with Zepbound and improving nicely.  Recheck liver enzymes today.  Continue Zepbound 15 mg once weekly.      Relevant Medications   ZEPBOUND 15 MG/0.5ML Pen   Other Relevant Orders   Comprehensive metabolic panel with GFR   Hemoglobin A1c   Hyperlipidemia (Chronic)   Chronic and stable.  Doing well on pitivastatin.  Other statin medications cause transient elevations in AST ALT, likely due to the underlying fatty liver disease.  Tolerating pitavastatin well with every other day dosing which we will continue.  Check cholesterol today.  Goal LDL less than 100.      Relevant Medications   Pitavastatin Calcium (LIVALO) 2 MG TABS   valsartan (DIOVAN) 160 MG tablet   Other Relevant Orders   Lipid panel     Medium    Lower urinary tract symptoms (LUTS) (Chronic)   Moderate burden of lower urinary tract symptoms over the last 6 months.  On ultrasound  he has an enlarged prostate diffusely with small protrusion into the bladder.  Will check PSA today.  Start Flomax 0.4 mg daily.      Relevant Medications   tamsulosin (FLOMAX) 0.4 MG CAPS capsule   Other Relevant Orders   PSA   OSA (obstructive sleep apnea) (Chronic)   Chronic and stable.  Doing well on treatment with CPAP and Zepbound.  Plan to continue Zepbound for medical management of OSA with 15 mg once weekly injections.      Relevant Medications   ZEPBOUND 15 MG/0.5ML Pen     Low   Encounter for screening for lung cancer (Chronic)   History of tobacco use, greater than 20-pack-year history and quit about 10 years ago.  No COPD.  Doing really well.  Had 2 low-dose CT scans around 2020 and 2021.  Interested in restarting the protocol.  We talked about the risks of false positives.  Decided to do one more low-dose CT scan which I have ordered.      Relevant Orders   CT CHEST LUNG CA SCREEN LOW DOSE W/O CM   Microscopic hematuria - Primary (Chronic)   Acute issue incidentally noted on a urinalysis at urgent care about 2 weeks ago.  He has a history of nephrolithiasis requiring extracorporeal shockwave lithotripsy around 2019.  Has not had symptomatic stone since that time.  There was uric acid on the recent urinalysis.  Consistent with uric acid stones likely related to metabolic disease.  Will recheck urinalysis today.  With tobacco use history he is also at above average risk for urothelial malignancy.  If urinalysis is still positive for microscopic hematuria will get a CT renal stone protocol.  If that is positive we will treat with urine alkalinization therapy with potassium citrate.      Relevant Orders   Urinalysis, Routine w reflex microscopic   OCD (obsessive compulsive disorder) (Chronic)   Chronic and stable.  Well-controlled on sertraline 100 mg daily which we will continue.      Relevant Medications   sertraline (ZOLOFT) 100 MG tablet   Other Visit Diagnoses        Need for vaccination against Streptococcus pneumoniae       Relevant Orders   Pneumococcal conjugate vaccine 20-valent (Prevnar 20)     Flu vaccine need       Relevant Orders   Flu vaccine trivalent PF, 6mos and older(Flulaval,Afluria,Fluarix,Fluzone)       Return in about 6 months (around 11/29/2024).   Cleatus Debby Specking, MD

## 2024-06-01 NOTE — Assessment & Plan Note (Signed)
 Chronic and stable.  Well-controlled on sertraline 100 mg daily which we will continue.

## 2024-06-01 NOTE — Assessment & Plan Note (Signed)
 Chronic and stable.  Doing well on pitivastatin.  Other statin medications cause transient elevations in AST ALT, likely due to the underlying fatty liver disease.  Tolerating pitavastatin well with every other day dosing which we will continue.  Check cholesterol today.  Goal LDL less than 100.

## 2024-06-01 NOTE — Assessment & Plan Note (Signed)
 Acute issue incidentally noted on a urinalysis at urgent care about 2 weeks ago.  He has a history of nephrolithiasis requiring extracorporeal shockwave lithotripsy around 2019.  Has not had symptomatic stone since that time.  There was uric acid on the recent urinalysis.  Consistent with uric acid stones likely related to metabolic disease.  Will recheck urinalysis today.  With tobacco use history he is also at above average risk for urothelial malignancy.  If urinalysis is still positive for microscopic hematuria will get a CT renal stone protocol.  If that is positive we will treat with urine alkalinization therapy with potassium citrate.

## 2024-06-01 NOTE — Assessment & Plan Note (Signed)
 History of tobacco use, greater than 20-pack-year history and quit about 10 years ago.  No COPD.  Doing really well.  Had 2 low-dose CT scans around 2020 and 2021.  Interested in restarting the protocol.  We talked about the risks of false positives.  Decided to do one more low-dose CT scan which I have ordered.

## 2024-06-02 ENCOUNTER — Ambulatory Visit: Payer: Self-pay | Admitting: Student in an Organized Health Care Education/Training Program

## 2024-06-02 DIAGNOSIS — E7800 Pure hypercholesterolemia, unspecified: Secondary | ICD-10-CM

## 2024-06-02 MED ORDER — PITAVASTATIN CALCIUM 2 MG PO TABS: 1.0000 | ORAL_TABLET | Freq: Every day | ORAL | 3 refills | Status: AC

## 2024-09-04 ENCOUNTER — Other Ambulatory Visit: Payer: Self-pay | Admitting: Student in an Organized Health Care Education/Training Program

## 2024-09-04 DIAGNOSIS — R399 Unspecified symptoms and signs involving the genitourinary system: Secondary | ICD-10-CM

## 2024-12-06 ENCOUNTER — Ambulatory Visit: Admitting: Student in an Organized Health Care Education/Training Program
